# Patient Record
Sex: Female | Born: 1965
Health system: Southern US, Community
[De-identification: ages and names within clinical notes are randomized; demographics above are authoritative.]

## PROBLEM LIST (undated history)

## (undated) DIAGNOSIS — F419 Anxiety disorder, unspecified: Secondary | ICD-10-CM

## (undated) HISTORY — PX: TUBAL LIGATION: SHX77

## (undated) HISTORY — PX: CHOLECYSTECTOMY: SHX55

## (undated) HISTORY — PX: CHOLECYSTECTOMY, LAPAROSCOPIC: SHX56

## (undated) HISTORY — DX: Anxiety disorder, unspecified: F41.9

---

## 2004-04-23 ENCOUNTER — Other Ambulatory Visit: Admission: RE | Admit: 2004-04-23 | Discharge: 2004-04-23 | Payer: Self-pay | Admitting: Family Medicine

## 2011-07-30 HISTORY — PX: BREAST SURGERY: SHX581

## 2012-02-04 ENCOUNTER — Other Ambulatory Visit: Payer: Self-pay | Admitting: Unknown Physician Specialty

## 2012-02-04 DIAGNOSIS — R921 Mammographic calcification found on diagnostic imaging of breast: Secondary | ICD-10-CM

## 2012-02-11 ENCOUNTER — Ambulatory Visit
Admission: RE | Admit: 2012-02-11 | Discharge: 2012-02-11 | Disposition: A | Discharge status: Home / Self Care | Payer: No Typology Code available for payment source | Source: Ambulatory Visit | Attending: Unknown Physician Specialty | Admitting: Unknown Physician Specialty

## 2012-02-11 DIAGNOSIS — R921 Mammographic calcification found on diagnostic imaging of breast: Secondary | ICD-10-CM

## 2016-12-19 ENCOUNTER — Ambulatory Visit (INDEPENDENT_AMBULATORY_CARE_PROVIDER_SITE_OTHER): Payer: 59 | Admitting: Obstetrics & Gynecology

## 2016-12-19 ENCOUNTER — Encounter: Payer: Self-pay | Admitting: Obstetrics & Gynecology

## 2016-12-19 VITALS — BP 126/84 | Ht 65.5 in | Wt 149.8 lb

## 2016-12-19 DIAGNOSIS — Z803 Family history of malignant neoplasm of breast: Secondary | ICD-10-CM | POA: Diagnosis not present

## 2016-12-19 DIAGNOSIS — Z01419 Encounter for gynecological examination (general) (routine) without abnormal findings: Secondary | ICD-10-CM

## 2016-12-19 DIAGNOSIS — Z780 Asymptomatic menopausal state: Secondary | ICD-10-CM | POA: Diagnosis not present

## 2016-12-19 DIAGNOSIS — Z1151 Encounter for screening for human papillomavirus (HPV): Secondary | ICD-10-CM

## 2016-12-19 DIAGNOSIS — Z113 Encounter for screening for infections with a predominantly sexual mode of transmission: Secondary | ICD-10-CM

## 2016-12-19 NOTE — Patient Instructions (Signed)
1. Encounter for routine gynecological examination with Papanicolaou smear of cervix Normal Gyn exam.  Pap/HPV/Gono-Chlam done.  Will schedule screening Mammo.  2. Menopause present No HRT.  Well tolerated.  Recommend Vit D supplement, Ca++ in diet and weight bearing physical activity. - DG Bone Density; Future  3. Family history of breast cancer Mother and sister in 40's.  BrCa 1-2 neg, done by sister.  Hormone receptors pos.  Breast exam normal today.  Will schedule screening Mammo asap.   Olivia Conrad, it was a pleasure to meet you today!  I will inform you of your results as soon as available. 

## 2016-12-19 NOTE — Progress Notes (Signed)
Olivia Conrad 09-Apr-1966 [id number]   History:    51 y.o. G3P3 Divorced.  Boyfriend x 14 yrs.  4 grand-children.  RP:  New patient presenting for annual gyn exam   HPI:  Menopause x 2016.  No HRT.  Mild hot flushes and emotional moments.  No pelvic pain.  No PMB.  No vaginal d/c.  Occasionally sexually active with long term boyfriend.  Breasts wnl.  Mother Breast Ca at 4 yo and sister Breast Ca at 18 yo.  Sister BrCa1-2 Neg.  Hormone receptors pos. Both alive.    Past medical history,surgical history, family history and social history were all reviewed and documented in the EPIC chart.  Gynecologic History No LMP recorded. Contraception: post menopausal status Last Pap: 2015. Results were: normal Last mammogram: 2015. Results were: normal  Obstetric History OB History  No data available     ROS: A ROS was performed and pertinent positives and negatives are included in the history.  GENERAL: No fevers or chills. HEENT: No change in vision, no earache, sore throat or sinus congestion. NECK: No pain or stiffness. CARDIOVASCULAR: No chest pain or pressure. No palpitations. PULMONARY: No shortness of breath, cough or wheeze. GASTROINTESTINAL: No abdominal pain, nausea, vomiting or diarrhea, melena or bright red blood per rectum. GENITOURINARY: No urinary frequency, urgency, hesitancy or dysuria. MUSCULOSKELETAL: No joint or muscle pain, no back pain, no recent trauma. DERMATOLOGIC: No rash, no itching, no lesions. ENDOCRINE: No polyuria, polydipsia, no heat or cold intolerance. No recent change in weight. HEMATOLOGICAL: No anemia or easy bruising or bleeding. NEUROLOGIC: No headache, seizures, numbness, tingling or weakness. PSYCHIATRIC: No depression, no loss of interest in normal activity or change in sleep pattern.     Exam:   Ht 5' 5.5" (1.664 m)   Wt 149 lb 12.8 oz (67.9 kg)   BMI 24.55 kg/m   Body mass index is 24.55 kg/m.  General appearance : Well developed well  nourished female. No acute distress HEENT: Eyes: no retinal hemorrhage or exudates,  Neck supple, trachea midline, no carotid bruits, no thyroidmegaly Lungs: Clear to auscultation, no rhonchi or wheezes, or rib retractions  Heart: Regular rate and rhythm, no murmurs or gallops Breast:Examined in sitting and supine position were symmetrical in appearance, no palpable masses or tenderness,  no skin retraction, no nipple inversion, no nipple discharge, no skin discoloration, no axillary or supraclavicular lymphadenopathy Abdomen: no palpable masses or tenderness, no rebound or guarding Extremities: no edema or skin discoloration or tenderness  Pelvic:  Bartholin, Urethra, Skene Glands: Within normal limits             Vagina: No gross lesions or discharge  Cervix: No gross lesions or discharge.  Pap/HPV, Gono-Chlam done.  Uterus  AV, normal size, shape and consistency, non-tender and mobile  Adnexa  Without masses or tenderness  Anus and perineum  normal                 Assessment/Plan:  51 y.o. female for annual exam   1. Encounter for routine gynecological examination with Papanicolaou smear of cervix Normal Gyn exam.  Pap/HPV/Gono-Chlam done.  Will schedule screening Mammo.  2. Menopause present No HRT.  Well tolerated.  Recommend Vit D supplement, Ca++ in diet and weight bearing physical activity. - DG Bone Density; Future  3. Family history of breast cancer Mother and sister in 64's.  BrCa 1-2 neg, done by sister.  Hormone receptors pos.  Breast exam normal today.  Will schedule  screening Mammo asap.  Princess Bruins MD, 10:59 AM 12/19/2016

## 2016-12-19 NOTE — Addendum Note (Signed)
Addended by: Berna SpareASTILLO, Kamyra Schroeck A on: 12/19/2016 12:08 PM   Modules accepted: Orders

## 2016-12-25 ENCOUNTER — Other Ambulatory Visit: Payer: Self-pay | Admitting: Gynecology

## 2016-12-25 DIAGNOSIS — Z780 Asymptomatic menopausal state: Secondary | ICD-10-CM

## 2016-12-25 DIAGNOSIS — Z13820 Encounter for screening for osteoporosis: Secondary | ICD-10-CM

## 2016-12-25 LAB — PAP IG, CT-NG NAA, HPV HIGH-RISK
CHLAMYDIA PROBE AMP: NOT DETECTED
GC PROBE AMP: NOT DETECTED
HPV DNA High Risk: NOT DETECTED

## 2017-01-03 ENCOUNTER — Encounter: Payer: Self-pay | Admitting: Physician Assistant

## 2017-01-03 ENCOUNTER — Ambulatory Visit (INDEPENDENT_AMBULATORY_CARE_PROVIDER_SITE_OTHER): Payer: 59 | Admitting: Physician Assistant

## 2017-01-03 ENCOUNTER — Ambulatory Visit (INDEPENDENT_AMBULATORY_CARE_PROVIDER_SITE_OTHER): Payer: 59

## 2017-01-03 VITALS — BP 135/72 | HR 97 | Temp 98.3°F | Resp 16 | Ht 65.5 in | Wt 147.6 lb

## 2017-01-03 DIAGNOSIS — M542 Cervicalgia: Secondary | ICD-10-CM | POA: Diagnosis not present

## 2017-01-03 MED ORDER — IBUPROFEN 800 MG PO TABS: 800.0000 mg | ORAL_TABLET | Freq: Three times a day (TID) | ORAL | 0 refills | Status: DC | PRN

## 2017-01-03 MED ORDER — CYCLOBENZAPRINE HCL 5 MG PO TABS: 5.0000 mg | ORAL_TABLET | Freq: Three times a day (TID) | ORAL | 0 refills | Status: DC | PRN

## 2017-01-03 NOTE — Patient Instructions (Signed)
     IF you received an x-ray today, you will receive an invoice from [facility]. Please contact  Radiology at [phone] with questions or concerns regarding your invoice.   IF you received labwork today, you will receive an invoice from [facility]. Please contact [facility] at [phone] with questions or concerns regarding your invoice.   Our billing staff will not be able to assist you with questions regarding bills from these companies.  You will be contacted with the lab results as soon as they are available. The fastest way to get your results is to activate your My Chart account. Instructions are located on the last page of this paperwork. If you have not heard from us regarding the results in 2 weeks, please contact this office.     

## 2017-01-03 NOTE — Progress Notes (Signed)
Olivia BanMelinda Conrad  MRN: [id number] DOB: 04/24/1966  PCP: Morrell RiddleWeber, Deshane Cotroneo L, PA-C  Chief Complaint  Patient presents with  . establish care  . neck pain    started in Feb. or March per pt, "shoots pain into the shoulder" causes left arm to go numb and tinglly    Subjective:  Pt presents to clinic for establish care.  She has not had routine medical care in a while.  Just set up care with GYN and is schedule for her mammogram.  Neck pain - tried home relief - ice and heat and motrin (400mg ) - now when she moves to neck to the right side she gets numbness and tingling in her right arm with the neck movement.  No known injury.  Review of Systems  Musculoskeletal: Positive for neck pain. Negative for joint swelling and neck stiffness.    There are no active problems to display for this patient.   Current Outpatient Prescriptions on File Prior to Visit  Medication Sig Dispense Refill  . ibuprofen (ADVIL,MOTRIN) 400 MG tablet Take 400 mg by mouth every 6 (six) hours as needed.    . Multiple Vitamin (MULTIVITAMIN) tablet Take 1 tablet by mouth daily.    . Turmeric Curcumin 500 MG CAPS Take by mouth.     No current facility-administered medications on file prior to visit.     Allergies  Allergen Reactions  . Aleve [Naproxen Sodium] Hives    Pt patients past, family and social history were reviewed and updated.   Objective:  BP 135/72   Pulse 97   Temp 98.3 F (36.8 C) (Oral)   Resp 16   Ht 5' 5.5" (1.664 m)   Wt 147 lb 9.6 oz (67 kg)   SpO2 99%   BMI 24.19 kg/m   Physical Exam  Constitutional: She is oriented to person, place, and time and well-developed, well-nourished, and in no distress.  HENT:  Head: Normocephalic and atraumatic.  Right Ear: Hearing and external ear normal.  Left Ear: Hearing and external ear normal.  Eyes: Conjunctivae are normal.  Neck: Normal range of motion.  Pulmonary/Chest: Effort normal.  Musculoskeletal:       Cervical back: She exhibits  spasm (right trap). She exhibits normal range of motion and no bony tenderness.  Neurological: She is alert and oriented to person, place, and time. She displays no weakness and normal reflexes. No sensory deficit. Gait normal.  Skin: Skin is warm and dry.  Psychiatric: Mood, memory, affect and judgment normal.  Vitals reviewed.   Dg Cervical Spine 2 Or 3 Views  Result Date: 01/03/2017 CLINICAL DATA:  Neck pain for several months, no known injury, initial encounter EXAM: CERVICAL SPINE - 2-3 VIEW COMPARISON:  None. FINDINGS: Seven cervical segments are well visualized. Vertebral body height is well maintained. Osteophytic changes are noted at C5-6 and C6-7. No acute fracture or acute facet abnormality is seen. No soft tissue abnormality is noted. IMPRESSION: Degenerative change without acute abnormality. Electronically Signed   By: Alcide CleverMark  Lukens M.D.   On: 01/03/2017 12:55     Assessment and Plan :  Neck pain - Plan: DG Cervical Spine 2 or 3 views, cyclobenzaprine (FLEXERIL) 5 MG tablet, ibuprofen (ADVIL,MOTRIN) 800 MG tablet - likely muscle spasms resting on a nerve - trial of muscle relaxers and heat - she xray today does not show bad degenerative changes and her exam looks ok - RTC if not improved - pt encouraged to make a CPE for general labs,  imunizations etc.  Benny Lennert PA-C  Primary Care at [facility] 01/06/2017 8:29 AM

## 2017-01-07 ENCOUNTER — Ambulatory Visit (INDEPENDENT_AMBULATORY_CARE_PROVIDER_SITE_OTHER): Payer: 59

## 2017-01-07 ENCOUNTER — Other Ambulatory Visit: Payer: Self-pay | Admitting: Gynecology

## 2017-01-07 ENCOUNTER — Encounter: Payer: Self-pay | Admitting: Gynecology

## 2017-01-07 ENCOUNTER — Encounter: Payer: Self-pay | Admitting: Obstetrics & Gynecology

## 2017-01-07 DIAGNOSIS — M8588 Other specified disorders of bone density and structure, other site: Secondary | ICD-10-CM | POA: Diagnosis not present

## 2017-01-07 DIAGNOSIS — Z13820 Encounter for screening for osteoporosis: Secondary | ICD-10-CM | POA: Diagnosis not present

## 2017-01-07 DIAGNOSIS — Z780 Asymptomatic menopausal state: Secondary | ICD-10-CM

## 2017-01-13 ENCOUNTER — Telehealth: Payer: Self-pay | Admitting: Physician Assistant

## 2017-01-13 NOTE — Telephone Encounter (Signed)
Pt called asking about her xrays & just wanted some further explanation on them & figure out what her next step would be. She is still having shoulder issues, the meds are working & she can bear the pain but it is not completely gone away.   Please Advise

## 2017-01-13 NOTE — Telephone Encounter (Signed)
Dg Cervical Spine 2 Or 3 Views  Result Date: 01/03/2017 CLINICAL DATA:  Neck pain for several months, no known injury, initial encounter EXAM: CERVICAL SPINE - 2-3 VIEW COMPARISON:  None. FINDINGS: Seven cervical segments are well visualized. Vertebral body height is well maintained. Osteophytic changes are noted at C5-6 and C6-7. No acute fracture or acute facet abnormality is seen. No soft tissue abnormality is noted. IMPRESSION: Degenerative change without acute abnormality. Electronically Signed   By: Alcide CleverMark  Lukens M.D.   On: 01/03/2017 12:55   Please call the patient and let her know that she has degenerative changes at C5/6 C6/7.  fi she has further questions please get them for me if you are unable to answer them.

## 2017-01-13 NOTE — Telephone Encounter (Signed)
Please advise 

## 2017-01-13 NOTE — Telephone Encounter (Signed)
Olivia Conrad - Pt wants some clarification about the xrays on her neck.  [phone]

## 2017-01-14 ENCOUNTER — Encounter: Payer: Self-pay | Admitting: Physician Assistant

## 2017-01-14 NOTE — Telephone Encounter (Signed)
I will close this message as there is another message with the same information.

## 2017-01-14 NOTE — Telephone Encounter (Signed)
If the patient is still having numbness this could be a nerve problem.  It is important to know whether she has lost feeling and has weakness or whether it feels tingling.  Lost sensation means nerve damage - tingling means nerve irritation - if just tingling continue medications and PT could be helpful if the patient is willing -if lost of sensation and or weakness either a repeat with me for repeat exam and possible MRI or referral to a specialist.

## 2017-01-14 NOTE — Telephone Encounter (Signed)
I have responded to another message just like this with an explanation of her xrays that I have sent to the xray pool - we might want to see whether they have called the patient and answered her questions.  She may need to continue the medication and maybe do some PT to help with long term pain and possible return of pain.

## 2017-01-14 NOTE — Telephone Encounter (Signed)
Pls see msg below

## 2017-01-14 NOTE — Telephone Encounter (Signed)
Please advise 

## 2017-01-14 NOTE — Telephone Encounter (Signed)
Spoke with Olivia Conrad, explained the scan showed degenerative changes in her cervical spine (neck area) I explained that it is not uncommon for postmenopausal women to have degenerative changes in there bones which is why we do DEXA screens. Olivia Conrad states that the medication has improved her pain, however she still has numbness that starts in her left shoulder and radiates down her left arm. Numbness gets worse with certain position changes like laying on her left side and bending over at the waist like "touching her toes." Olivia Conrad wants to know if there is anything she can do about this numbness. She also has questions about preventing future degenerative damage. Pls advise.

## 2017-01-17 ENCOUNTER — Other Ambulatory Visit: Payer: Self-pay | Admitting: Physician Assistant

## 2017-01-17 DIAGNOSIS — M542 Cervicalgia: Secondary | ICD-10-CM

## 2017-01-17 MED ORDER — CYCLOBENZAPRINE HCL 5 MG PO TABS: 5.0000 mg | ORAL_TABLET | Freq: Three times a day (TID) | ORAL | 0 refills | Status: DC | PRN

## 2017-01-17 MED ORDER — IBUPROFEN 800 MG PO TABS: 800.0000 mg | ORAL_TABLET | Freq: Three times a day (TID) | ORAL | 0 refills | Status: DC | PRN

## 2017-01-21 NOTE — Telephone Encounter (Signed)
Called, left message on AM to call back. 

## 2017-01-23 NOTE — Telephone Encounter (Signed)
The patient returned my call, she stated that her pain in shoulder blade is totally gone, she feel so much better with her arm also. She has no tingling or numbness. She stated that she understood is a slow process of recovery, she still take her meds. until she feel that the pain is totally gone. Advised to call back as need it.

## 2017-01-28 NOTE — Telephone Encounter (Signed)
Please make a copy of her xrays and put up front for the patient to pick up

## 2017-02-05 ENCOUNTER — Encounter: Payer: Self-pay | Admitting: Physician Assistant

## 2017-02-11 ENCOUNTER — Encounter: Payer: 59 | Admitting: Physician Assistant

## 2017-04-01 ENCOUNTER — Encounter: Payer: Self-pay | Admitting: Physician Assistant

## 2017-04-01 ENCOUNTER — Ambulatory Visit (INDEPENDENT_AMBULATORY_CARE_PROVIDER_SITE_OTHER): Payer: 59 | Admitting: Physician Assistant

## 2017-04-01 VITALS — BP 108/70 | HR 69 | Temp 98.1°F | Resp 18 | Ht 66.0 in | Wt 146.0 lb

## 2017-04-01 DIAGNOSIS — Z130 Encounter for screening for diseases of the blood and blood-forming organs and certain disorders involving the immune mechanism: Secondary | ICD-10-CM | POA: Diagnosis not present

## 2017-04-01 DIAGNOSIS — Z13228 Encounter for screening for other metabolic disorders: Secondary | ICD-10-CM | POA: Diagnosis not present

## 2017-04-01 DIAGNOSIS — Z23 Encounter for immunization: Secondary | ICD-10-CM

## 2017-04-01 DIAGNOSIS — Z114 Encounter for screening for human immunodeficiency virus [HIV]: Secondary | ICD-10-CM | POA: Diagnosis not present

## 2017-04-01 DIAGNOSIS — Z13220 Encounter for screening for lipoid disorders: Secondary | ICD-10-CM

## 2017-04-01 DIAGNOSIS — Z0000 Encounter for general adult medical examination without abnormal findings: Secondary | ICD-10-CM | POA: Diagnosis not present

## 2017-04-01 DIAGNOSIS — Z1329 Encounter for screening for other suspected endocrine disorder: Secondary | ICD-10-CM | POA: Diagnosis not present

## 2017-04-01 NOTE — Progress Notes (Signed)
Olivia Conrad  MRN: [id number] DOB: 1965/12/02  PCP: Mancel Bale, PA-C  Subjective:  Pt presents to clinic for a CPE.  Last dental exam: not in years Last vision exam: wears readers Last pap: 5/18 Last mammo: 6/18  Vaccinations      Tetanus - needs  She has been seeing [employer].  She feels much better and is almost having no neck pain.  She plans to finish her treatment plan and then continue for preventative measures.  Typical meals for patient: 3 meals - home cooked - really been trying to be more health aware Typical beverage choices: water - no more coffee during weekdays, 1 sundrop a day Exercises: 1.5 mile 2-3 times a week - but also very active at home Sleeps: 8 hrs per night and sleeping well   There are no active problems to display for this patient.   Review of Systems  Constitutional: Negative.   HENT: Negative.   Eyes: Negative.   Respiratory: Negative.   Cardiovascular: Negative.   Gastrointestinal: Negative.   Endocrine: Negative.   Genitourinary: Negative.   Musculoskeletal: Negative.   Skin: Negative.   Allergic/Immunologic: Negative.   Neurological: Negative.   Hematological: Negative.   Psychiatric/Behavioral: Negative.      Current Outpatient Prescriptions on File Prior to Visit  Medication Sig Dispense Refill  . cyclobenzaprine (FLEXERIL) 5 MG tablet Take 1 tablet (5 mg total) by mouth 3 (three) times daily as needed for muscle spasms. 90 tablet 0  . ibuprofen (ADVIL,MOTRIN) 400 MG tablet Take 400 mg by mouth every 6 (six) hours as needed.    Marland Kitchen ibuprofen (ADVIL,MOTRIN) 800 MG tablet Take 1 tablet (800 mg total) by mouth every 8 (eight) hours as needed. 90 tablet 0  . Multiple Vitamin (MULTIVITAMIN) tablet Take 1 tablet by mouth daily.    . Turmeric Curcumin 500 MG CAPS Take by mouth.     No current facility-administered medications on file prior to visit.     Allergies  Allergen Reactions  . Aleve [Naproxen Sodium] Hives      Social History   Social History  . Marital status: Single    Spouse name: N/A  . Number of children: N/A  . Years of education: N/A   Occupational History  . account manager    Social History Main Topics  . Smoking status: Current Every Day Smoker    Packs/day: 0.30    Years: 32.00    Types: Cigarettes  . Smokeless tobacco: Never Used  . Alcohol use Yes     Comment: social rare  . Drug use: No  . Sexual activity: Yes    Partners: Male   Other Topics Concern  . None   Social History Narrative   Lives with youngest daughter   3 children - 2 daughters and 1 son   Grandchildren - 52      Seatbelt - 100%   Guns in home - secured    Past Surgical History:  Procedure Laterality Date  . BREAST SURGERY Right 2013   breast biopsy  . CHOLECYSTECTOMY    . CHOLECYSTECTOMY, LAPAROSCOPIC    . TUBAL LIGATION      Family History  Problem Relation Age of Onset  . Breast cancer Mother 60       20 lymphnodes removed  . Breast cancer Sister 32       invasive ductal carcinoma   . Stroke Maternal Grandmother   . Cancer Maternal Grandfather  lung  . Breast cancer Paternal Grandmother      Objective:  BP 108/70   Pulse 69   Temp 98.1 F (36.7 C) (Oral)   Resp 18   Ht _0  (1.676 m)   Wt 146 lb (66.2 kg)   SpO2 97%   BMI 23.57 kg/m   Physical Exam  Constitutional: She is oriented to person, place, and time and well-developed, well-nourished, and in no distress.  HENT:  Head: Normocephalic and atraumatic.  Right Ear: Hearing, tympanic membrane, external ear and ear canal normal.  Left Ear: Hearing, tympanic membrane, external ear and ear canal normal.  Nose: Nose normal.  Mouth/Throat: Uvula is midline, oropharynx is clear and moist and mucous membranes are normal.  Eyes: Pupils are equal, round, and reactive to light. Conjunctivae and EOM are normal.  Neck: Trachea normal and normal range of motion. Neck supple. No thyroid mass and no thyromegaly  present.  Cardiovascular: Normal rate, regular rhythm and normal heart sounds.   No murmur heard. Pulmonary/Chest: Effort normal and breath sounds normal. She has no wheezes.  Abdominal: Soft. Bowel sounds are normal. There is no tenderness.  Musculoskeletal: Normal range of motion.  Lymphadenopathy:    She has no cervical adenopathy.  Neurological: She is alert and oriented to person, place, and time. She has normal motor skills, normal sensation, normal strength and normal reflexes. Gait normal.  Skin: Skin is warm and dry.  Psychiatric: Mood, memory, affect and judgment normal.    Wt Readings from Last 3 Encounters:  04/01/17 146 lb (66.2 kg)  01/03/17 147 lb 9.6 oz (67 kg)  12/19/16 149 lb 12.8 oz (67.9 kg)     Visual Acuity Screening   Right eye Left eye Both eyes  Without correction: 20/13 20/13 20/10-2  With correction:       Assessment and Plan :  Annual physical  - anticipatory guidance - encouraged patient to make a dentist appt.  Need for diphtheria-tetanus-pertussis (Tdap) vaccine - Plan: Tdap vaccine greater than or equal to 7yo IM  Screening for metabolic disorder - Plan: CMP14+EGFR  Screening, lipid - Plan: Lipid panel  Screening for thyroid disorder - Plan: TSH  Screening for HIV (human immunodeficiency virus) - Plan: HIV antibody  Screening for deficiency anemia - Plan: CBC with Differential/Platelet  Windell Hummingbird PA-C  Primary Care at [place] 04/01/2017 9:22 AM

## 2017-04-01 NOTE — Patient Instructions (Addendum)
I will contact you with your lab results as soon as they are available.   If you have not heard from me in 2 weeks, please contact me.  The fastest way to get your results is to register for My Chart (see the instructions on this printout).     IF you received an x-ray today, you will receive an invoice from [facility]. Please contact [facility] at [phone] with questions or concerns regarding your invoice.   IF you received labwork today, you will receive an invoice from [facility]. Please contact [facility] at [phone] with questions or concerns regarding your invoice.   Our billing staff will not be able to assist you with questions regarding bills from these companies.  You will be contacted with the lab results as soon as they are available. The fastest way to get your results is to activate your My Chart account. Instructions are located on the last page of this paperwork. If you have not heard from us regarding the results in 2 weeks, please contact this office.    Health Maintenance, Female Adopting a healthy lifestyle and getting preventive care can go a long way to promote health and wellness. Talk with your health care provider about what schedule of regular examinations is right for you. This is a good chance for you to check in with your provider about disease prevention and staying healthy. In between checkups, there are plenty of things you can do on your own. Experts have done a lot of research about which lifestyle changes and preventive measures are most likely to keep you healthy. Ask your health care provider for more information. Weight and diet Eat a healthy diet  Be sure to include plenty of vegetables, fruits, low-fat dairy products, and lean protein.  Do not eat a lot of foods high in solid fats, added sugars, or salt.  Get regular exercise. This is one of the most important things you can do for your health. ? Most adults should exercise  for at least 150 minutes each week. The exercise should increase your heart rate and make you sweat (moderate-intensity exercise). ? Most adults should also do strengthening exercises at least twice a week. This is in addition to the moderate-intensity exercise.  Maintain a healthy weight  Body mass index (BMI) is a measurement that can be used to identify possible weight problems. It estimates body fat based on height and weight. Your health care provider can help determine your BMI and help you achieve or maintain a healthy weight.  For females 20 years of age and older: ? A BMI below 18.5 is considered underweight. ? A BMI of 18.5 to 24.9 is normal. ? A BMI of 25 to 29.9 is considered overweight. ? A BMI of 30 and above is considered obese.  Watch levels of cholesterol and blood lipids  You should start having your blood tested for lipids and cholesterol at 49 years of age, then have this test every 5 years.  You may need to have your cholesterol levels checked more often if: ? Your lipid or cholesterol levels are high. ? You are older than 50 years of age. ? You are at high risk for heart disease.  Cancer screening Lung Cancer  Lung cancer screening is recommended for adults 55-80 years old who are at high risk for lung cancer because of a history of smoking.  A yearly low-dose CT scan of the lungs is recommended for people who: ? Currently smoke. ?   within the past 15 years. ? Have at least a 30-pack-year history of smoking. A pack year is smoking an average of one pack of cigarettes a day for 1 year.  Yearly screening should continue until it has been 15 years since you quit.  Yearly screening should stop if you develop a health problem that would prevent you from having lung cancer treatment.  Breast Cancer  Practice breast self-awareness. This means understanding how your breasts normally appear and feel.  It also means doing regular breast self-exams. Let your  health care provider know about any changes, no matter how small.  If you are in your 20s or 30s, you should have a clinical breast exam (CBE) by a health care provider every 1-3 years as part of a regular health exam.  If you are 23 or older, have a CBE every year. Also consider having a breast X-ray (mammogram) every year.  If you have a family history of breast cancer, talk to your health care provider about genetic screening.  If you are at high risk for breast cancer, talk to your health care provider about having an MRI and a mammogram every year.  Breast cancer gene (BRCA) assessment is recommended for women who have family members with BRCA-related cancers. BRCA-related cancers include: ? Breast. ? Ovarian. ? Tubal. ? Peritoneal cancers.  Results of the assessment will determine the need for genetic counseling and BRCA1 and BRCA2 testing.  Cervical Cancer Your health care provider may recommend that you be screened regularly for cancer of the pelvic organs (ovaries, uterus, and vagina). This screening involves a pelvic examination, including checking for microscopic changes to the surface of your cervix (Pap test). You may be encouraged to have this screening done every 3 years, beginning at age 60.  For women ages 18-65, health care providers may recommend pelvic exams and Pap testing every 3 years, or they may recommend the Pap and pelvic exam, combined with testing for human papilloma virus (HPV), every 5 years. Some types of HPV increase your risk of cervical cancer. Testing for HPV may also be done on women of any age with unclear Pap test results.  Other health care providers may not recommend any screening for nonpregnant women who are considered low risk for pelvic cancer and who do not have symptoms. Ask your health care provider if a screening pelvic exam is right for you.  If you have had past treatment for cervical cancer or a condition that could lead to cancer, you need  Pap tests and screening for cancer for at least 20 years after your treatment. If Pap tests have been discontinued, your risk factors (such as having a new sexual partner) need to be reassessed to determine if screening should resume. Some women have medical problems that increase the chance of getting cervical cancer. In these cases, your health care provider may recommend more frequent screening and Pap tests.  Colorectal Cancer  This type of cancer can be detected and often prevented.  Routine colorectal cancer screening usually begins at 49 years of age and continues through 49 years of age.  Your health care provider may recommend screening at an earlier age if you have risk factors for colon cancer.  Your health care provider may also recommend using home test kits to check for hidden blood in the stool.  A small camera at the end of a tube can be used to examine your colon directly (sigmoidoscopy or colonoscopy). This is done to check  for the earliest forms of colorectal cancer.  Routine screening usually begins at age 75.  Direct examination of the colon should be repeated every 5-10 years through 49 years of age. However, you may need to be screened more often if early forms of precancerous polyps or small growths are found.  Skin Cancer  Check your skin from head to toe regularly.  Tell your health care provider about any new moles or changes in moles, especially if there is a change in a mole's shape or color.  Also tell your health care provider if you have a mole that is larger than the size of a pencil eraser.  Always use sunscreen. Apply sunscreen liberally and repeatedly throughout the day.  Protect yourself by wearing long sleeves, pants, a wide-brimmed hat, and sunglasses whenever you are outside.  Heart disease, diabetes, and high blood pressure  High blood pressure causes heart disease and increases the risk of stroke. High blood pressure is more likely to develop  in: ? People who have blood pressure in the high end of the normal range (130-139/85-89 mm Hg). ? People who are overweight or obese. ? People who are African American.  If you are 41-67 years of age, have your blood pressure checked every 3-5 years. If you are 32 years of age or older, have your blood pressure checked every year. You should have your blood pressure measured twice-once when you are at a hospital or clinic, and once when you are not at a hospital or clinic. Record the average of the two measurements. To check your blood pressure when you are not at a hospital or clinic, you can use: ? An automated blood pressure machine at a pharmacy. ? A home blood pressure monitor.  If you are between 44 years and 70 years old, ask your health care provider if you should take aspirin to prevent strokes.  Have regular diabetes screenings. This involves taking a blood sample to check your fasting blood sugar level. ? If you are at a normal weight and have a low risk for diabetes, have this test once every three years after 49 years of age. ? If you are overweight and have a high risk for diabetes, consider being tested at a younger age or more often. Preventing infection Hepatitis B  If you have a higher risk for hepatitis B, you should be screened for this virus. You are considered at high risk for hepatitis B if: ? You were born in a country where hepatitis B is common. Ask your health care provider which countries are considered high risk. ? Your parents were born in a high-risk country, and you have not been immunized against hepatitis B (hepatitis B vaccine). ? You have HIV or AIDS. ? You use needles to inject street drugs. ? You live with someone who has hepatitis B. ? You have had sex with someone who has hepatitis B. ? You get hemodialysis treatment. ? You take certain medicines for conditions, including cancer, organ transplantation, and autoimmune conditions.  Hepatitis C  Blood  testing is recommended for: ? Everyone born from [birth year] through 1963. ? Anyone with known risk factors for hepatitis C.  Sexually transmitted infections (STIs)  You should be screened for sexually transmitted infections (STIs) including gonorrhea and chlamydia if: ? You are sexually active and are younger than 49 years of age. ? You are older than 49 years of age and your health care provider tells you that you are at risk for  this type of infection. ? Your sexual activity has changed since you were last screened and you are at an increased risk for chlamydia or gonorrhea. Ask your health care provider if you are at risk.  If you do not have HIV, but are at risk, it may be recommended that you take a prescription medicine daily to prevent HIV infection. This is called pre-exposure prophylaxis (PrEP). You are considered at risk if: ? You are sexually active and do not regularly use condoms or know the HIV status of your partner(s). ? You take drugs by injection. ? You are sexually active with a partner who has HIV.  Talk with your health care provider about whether you are at high risk of being infected with HIV. If you choose to begin PrEP, you should first be tested for HIV. You should then be tested every 3 months for as long as you are taking PrEP. Pregnancy  If you are premenopausal and you may become pregnant, ask your health care provider about preconception counseling.  If you may become pregnant, take 400 to 800 micrograms (mcg) of folic acid every day.  If you want to prevent pregnancy, talk to your health care provider about birth control (contraception). Osteoporosis and menopause  Osteoporosis is a disease in which the bones lose minerals and strength with aging. This can result in serious bone fractures. Your risk for osteoporosis can be identified using a bone density scan.  If you are 68 years of age or older, or if you are at risk for osteoporosis and fractures, ask your  health care provider if you should be screened.  Ask your health care provider whether you should take a calcium or vitamin D supplement to lower your risk for osteoporosis.  Menopause may have certain physical symptoms and risks.  Hormone replacement therapy may reduce some of these symptoms and risks. Talk to your health care provider about whether hormone replacement therapy is right for you. Follow these instructions at home:  Schedule regular health, dental, and eye exams.  Stay current with your immunizations.  Do not use any tobacco products including cigarettes, chewing tobacco, or electronic cigarettes.  If you are pregnant, do not drink alcohol.  If you are breastfeeding, limit how much and how often you drink alcohol.  Limit alcohol intake to no more than 1 drink per day for nonpregnant women. One drink equals 12 ounces of beer, 5 ounces of wine, or 1 ounces of hard liquor.  Do not use street drugs.  Do not share needles.  Ask your health care provider for help if you need support or information about quitting drugs.  Tell your health care provider if you often feel depressed.  Tell your health care provider if you have ever been abused or do not feel safe at home. This information is not intended to replace advice given to you by your health care provider. Make sure you discuss any questions you have with your health care provider. Document Released: 01/28/2011 Document Revised: 12/21/2015 Document Reviewed: 04/18/2015 Elsevier Interactive Patient Education  [employer].

## 2017-04-02 LAB — CMP14+EGFR
A/G RATIO: 1.6 (ref 1.2–2.2)
ALT: 8 IU/L (ref 0–32)
AST: 12 IU/L (ref 0–40)
Albumin: 4.4 g/dL (ref 3.5–5.5)
Alkaline Phosphatase: 104 IU/L (ref 39–117)
BILIRUBIN TOTAL: 0.2 mg/dL (ref 0.0–1.2)
BUN/Creatinine Ratio: 12 (ref 9–23)
BUN: 8 mg/dL (ref 6–24)
CHLORIDE: 103 mmol/L (ref 96–106)
CO2: 24 mmol/L (ref 20–29)
Calcium: 9.6 mg/dL (ref 8.7–10.2)
Creatinine, Ser: 0.69 mg/dL (ref 0.57–1.00)
GFR calc non Af Amer: 103 mL/min/{1.73_m2} (ref >59)
GFR, EST AFRICAN AMERICAN: 119 mL/min/{1.73_m2} (ref >59)
Globulin, Total: 2.7 g/dL (ref 1.5–4.5)
Glucose: 94 mg/dL (ref 65–99)
POTASSIUM: 4.4 mmol/L (ref 3.5–5.2)
Sodium: 143 mmol/L (ref 134–144)
Total Protein: 7.1 g/dL (ref 6.0–8.5)

## 2017-04-02 LAB — LIPID PANEL
CHOL/HDL RATIO: 4.8 ratio — AB (ref 0.0–4.4)
Cholesterol, Total: 272 mg/dL — ABNORMAL HIGH (ref 100–199)
HDL: 57 mg/dL (ref >39)
LDL Calculated: 191 mg/dL — ABNORMAL HIGH (ref 0–99)
TRIGLYCERIDES: 118 mg/dL (ref 0–149)
VLDL Cholesterol Cal: 24 mg/dL (ref 5–40)

## 2017-04-02 LAB — CBC WITH DIFFERENTIAL/PLATELET
BASOS: 1 %
Basophils Absolute: 0.1 10*3/uL (ref 0.0–0.2)
EOS (ABSOLUTE): 0.4 10*3/uL (ref 0.0–0.4)
Eos: 4 %
Hematocrit: 42.9 % (ref 34.0–46.6)
Hemoglobin: 14 g/dL (ref 11.1–15.9)
Immature Grans (Abs): 0 10*3/uL (ref 0.0–0.1)
Immature Granulocytes: 0 %
LYMPHS ABS: 3.5 10*3/uL — AB (ref 0.7–3.1)
Lymphs: 40 %
MCH: 30.2 pg (ref 26.6–33.0)
MCHC: 32.6 g/dL (ref 31.5–35.7)
MCV: 93 fL (ref 79–97)
MONOS ABS: 0.7 10*3/uL (ref 0.1–0.9)
Monocytes: 8 %
NEUTROS ABS: 4.1 10*3/uL (ref 1.4–7.0)
Neutrophils: 47 %
PLATELETS: 297 10*3/uL (ref 150–379)
RBC: 4.64 x10E6/uL (ref 3.77–5.28)
RDW: 13.7 % (ref 12.3–15.4)
WBC: 8.9 10*3/uL (ref 3.4–10.8)

## 2017-04-02 LAB — HIV ANTIBODY (ROUTINE TESTING W REFLEX): HIV Screen 4th Generation wRfx: NONREACTIVE

## 2017-04-02 LAB — TSH: TSH: 0.798 u[IU]/mL (ref 0.450–4.500)

## 2017-06-10 IMAGING — DX DG CERVICAL SPINE 2 OR 3 VIEWS
3 series · 3 of 3 positions shown · non-contrast
Comparison: None.

CLINICAL DATA: Neck pain for several months, no known injury,
initial encounter

EXAM:
CERVICAL SPINE - 2-3 VIEW

[c-spine lat]
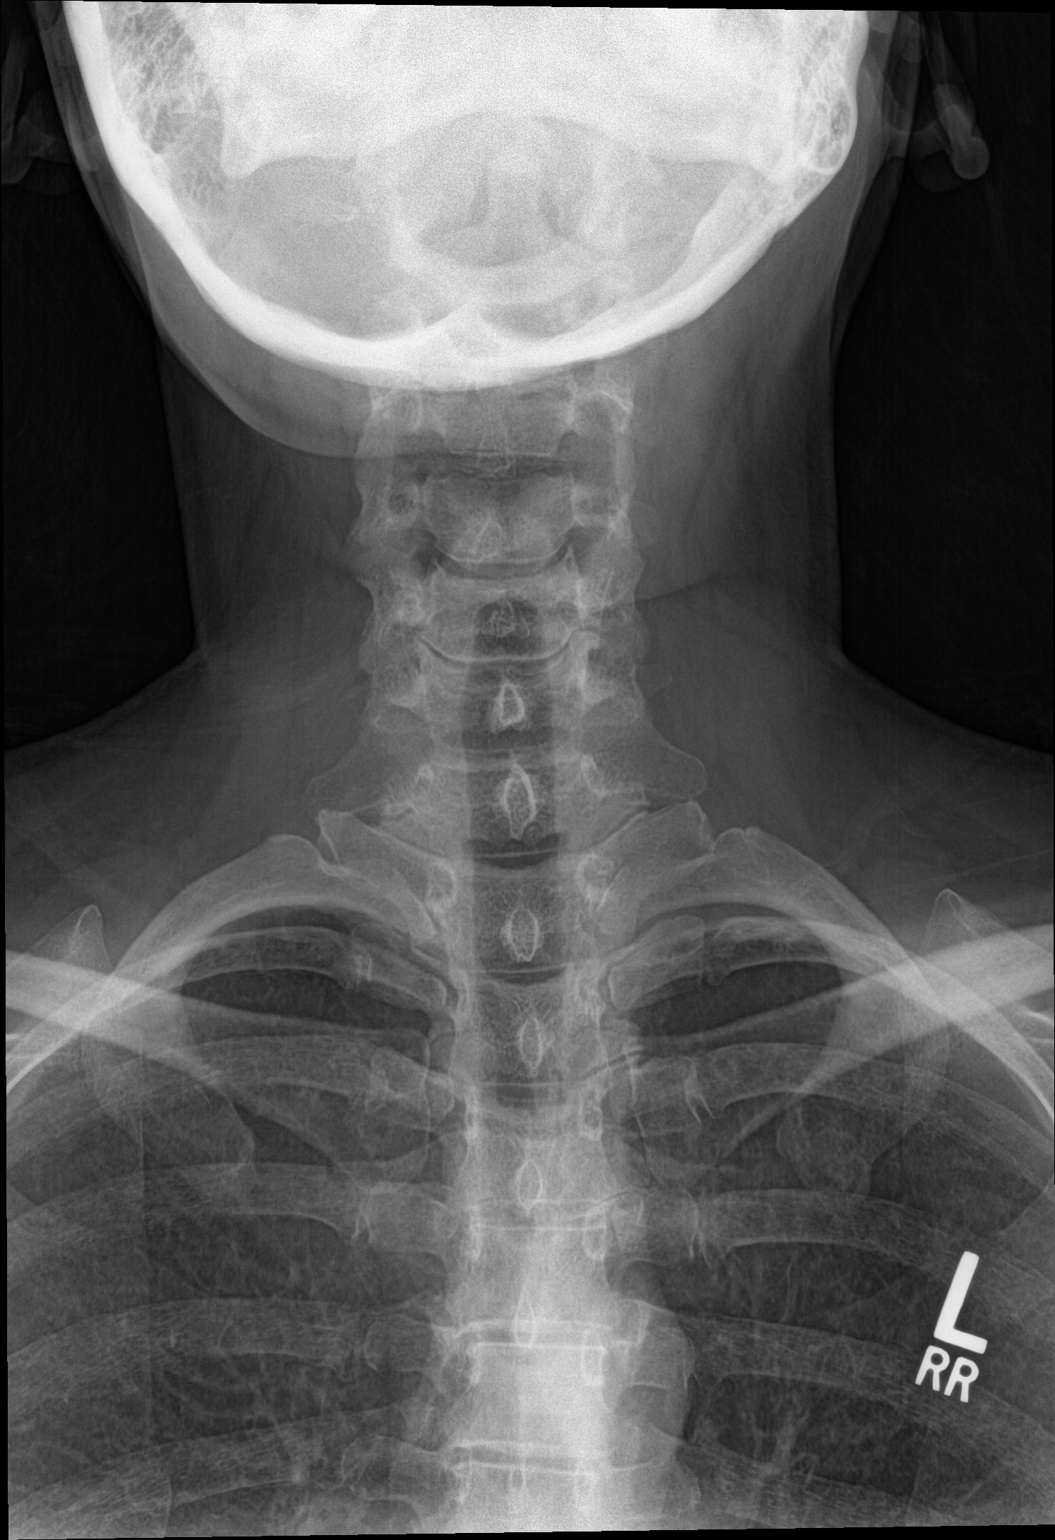

[c-spine open mouth]
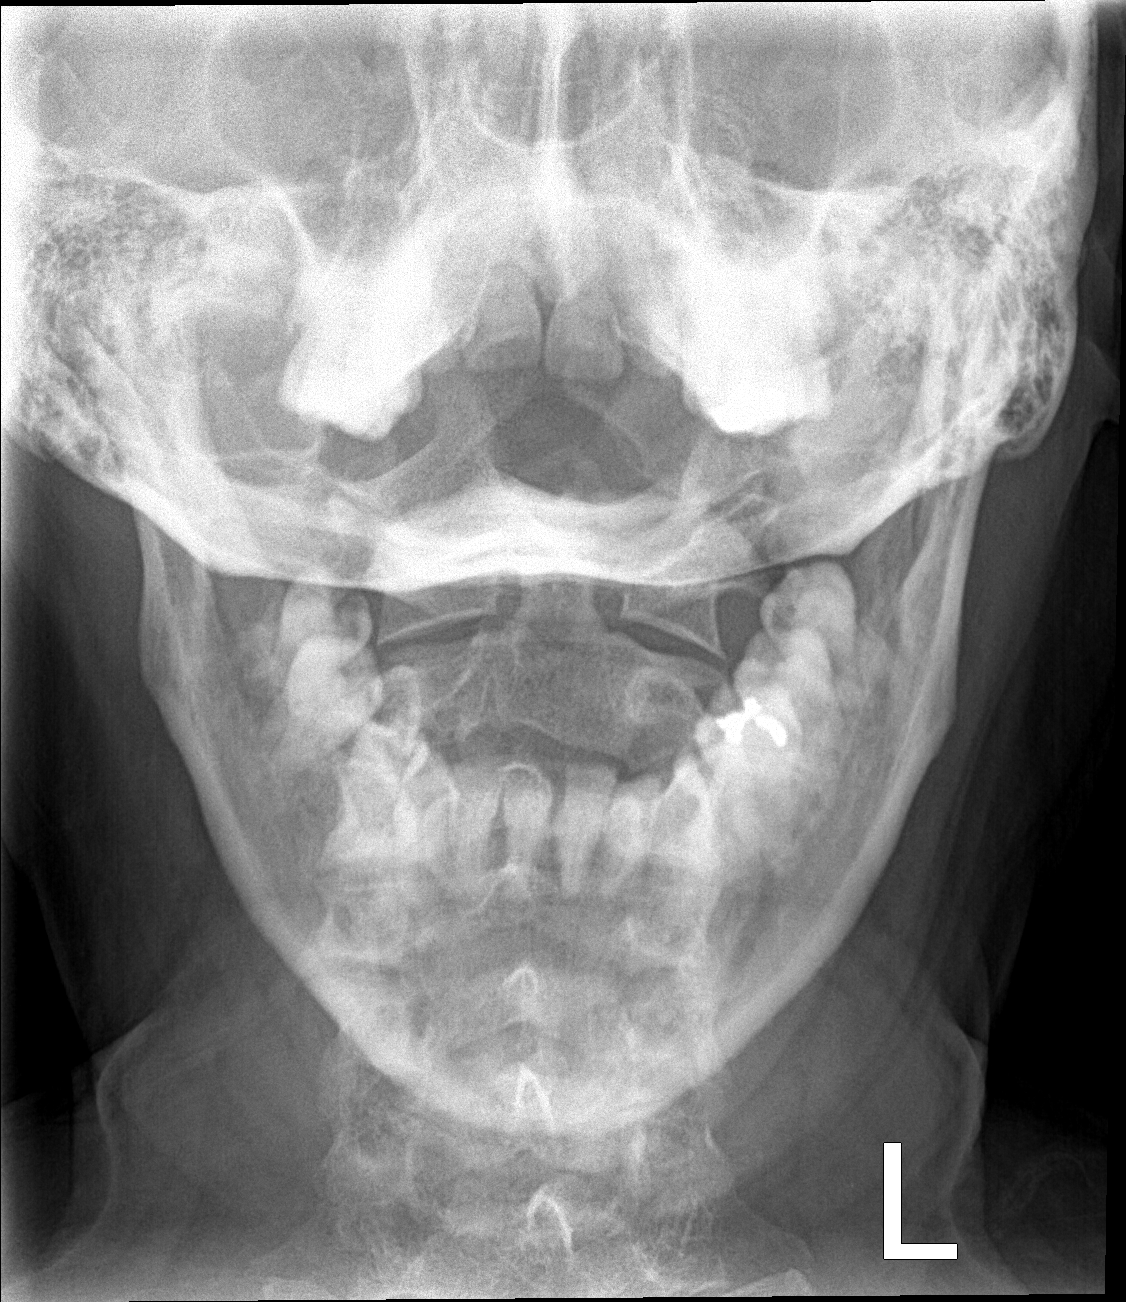

[[person_name]]
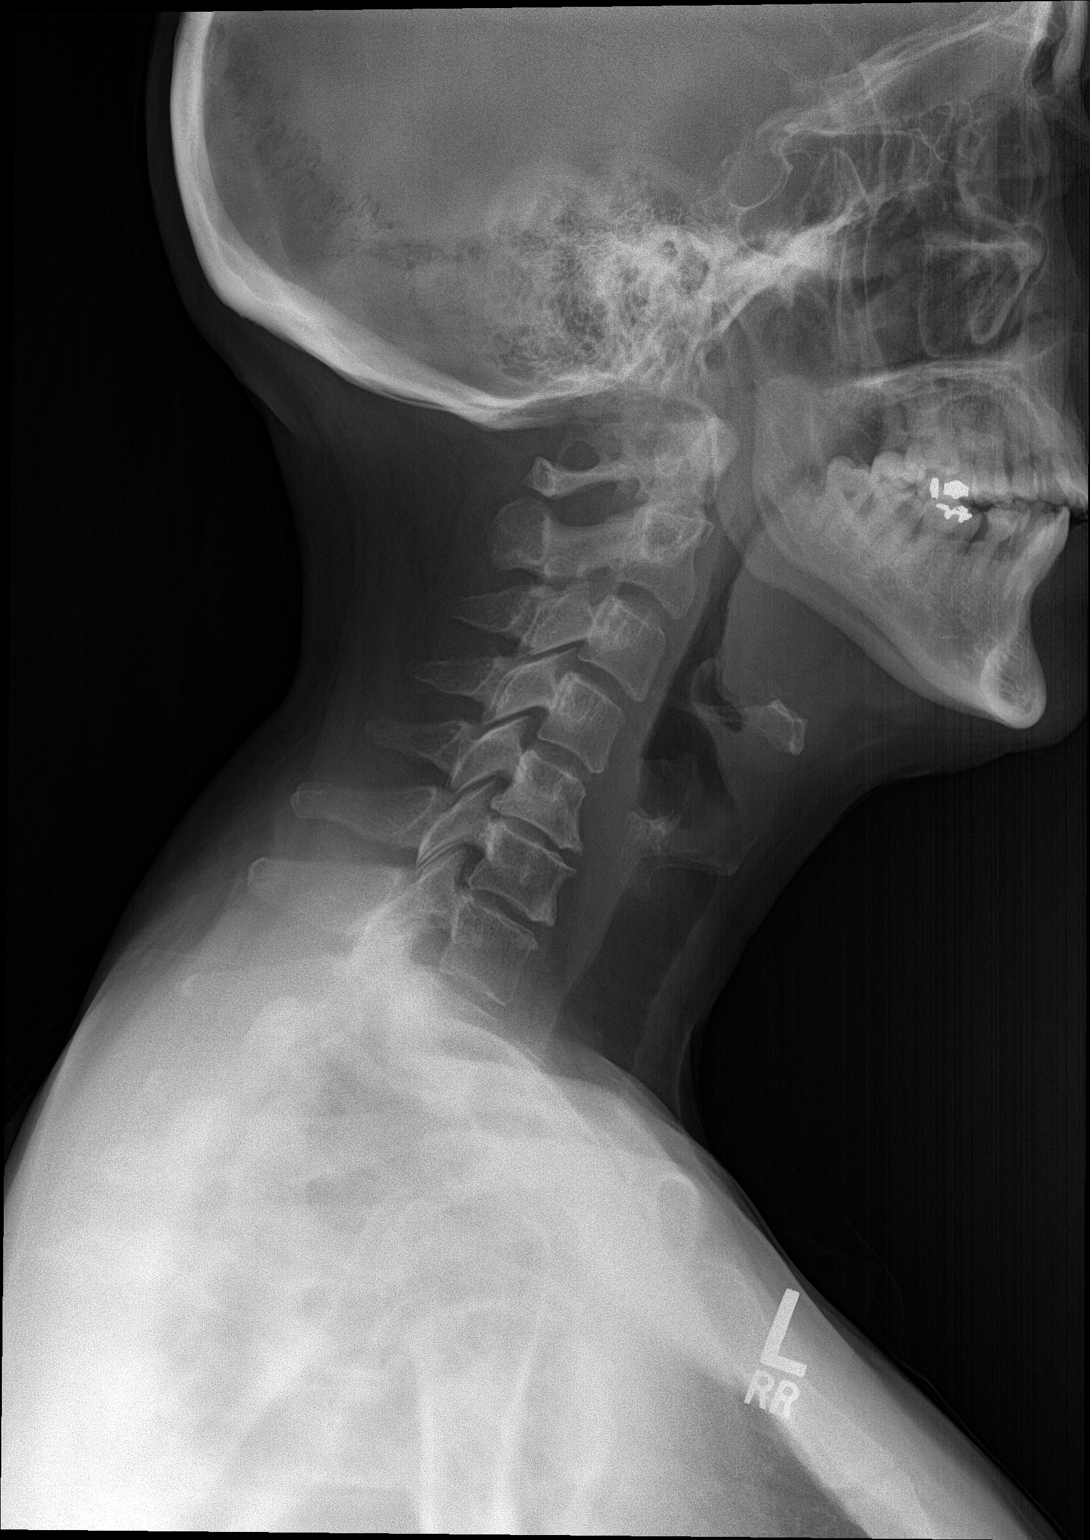

[3 of 3 positions shown; findings below may reference images not displayed]

FINDINGS: Seven cervical segments are well visualized. Vertebral body height
is well maintained. Osteophytic changes are noted at C5-6 and C6-7.
No acute fracture or acute facet abnormality is seen. No soft tissue
abnormality is noted.
IMPRESSION: Degenerative change without acute abnormality.

## 2017-12-23 ENCOUNTER — Ambulatory Visit (INDEPENDENT_AMBULATORY_CARE_PROVIDER_SITE_OTHER): Payer: 59 | Admitting: Obstetrics & Gynecology

## 2017-12-23 ENCOUNTER — Encounter: Payer: Self-pay | Admitting: Obstetrics & Gynecology

## 2017-12-23 VITALS — BP 126/78 | Ht 65.5 in | Wt 148.7 lb

## 2017-12-23 DIAGNOSIS — Z01419 Encounter for gynecological examination (general) (routine) without abnormal findings: Secondary | ICD-10-CM | POA: Diagnosis not present

## 2017-12-23 DIAGNOSIS — F329 Major depressive disorder, single episode, unspecified: Secondary | ICD-10-CM

## 2017-12-23 DIAGNOSIS — Z803 Family history of malignant neoplasm of breast: Secondary | ICD-10-CM

## 2017-12-23 DIAGNOSIS — Z780 Asymptomatic menopausal state: Secondary | ICD-10-CM | POA: Diagnosis not present

## 2017-12-23 NOTE — Patient Instructions (Signed)
1. Well female exam with routine gynecological exam Normal gynecologic exam.  Last Pap test May 2018 was negative with negative high risk HPV.  Patient abstinence since then.  Will repeat Pap test at 3 years.  Breast exam normal.  Last screening mammogram Benign in mother and sister with breast cancer.  June 2018.  Health labs with family physician.  Continue with good nutrition and fitness.  2. Menopause present Well on no hormone replacement therapy.  No postmenopausal bleeding.  Last bone density in June 2018 showed mild osteopenia only at the spine with a T score of -1.3.  Will repeat bone density at 3 years.  Continue with vitamin D supplements, calcium rich nutrition and regular weightbearing physical activity.  3. Family history of breast cancer Mother and sister with breast cancer.  4. Reactive depression Episodes of depressive symptoms and increased emotionality.  No suicidal ideations or plans.  Counseling done on readjusting her life with more social activities and physical activities.  Recommend psychotherapy.  Will not start on antidepressive medication at this time.  Olivia Conrad, it was a pleasure seeing you today!

## 2017-12-23 NOTE — Progress Notes (Signed)
Olivia Conrad 04/09/1966 [id number]   History:    52 y.o. Divorced.  Lives with her daughter.  Boyfriend x 15 years.  4 Grand-children.  RP:  Established patient presenting for annual gyn exam   HPI:  Menopause x 3 years.  No HRT.  No PMB.  No pelvic pain.  Urine/BMs wnl.  Normal vaginal secretions.  Abstinent x 2 years.  Boyfriend is more a companion, not sure about wanting to continue the relationship.  Breasts normal.  Mother and sister with breast ca.  Has episodes of depressive symptoms, over emotionality with crying.  No suicidal ideations or plans.  Feels that the episodes are getting worse in the last year.  Eating and sleeping well.  Just got a [employer], walking him every day.  Body mass index 24.37.  Health labs with Fam MD.  Past medical history,surgical history, family history and social history were all reviewed and documented in the EPIC chart.  Gynecologic History No LMP recorded. Patient is postmenopausal. Contraception: abstinence Last Pap: 11/2016. Results were: Negative, HPV HR neg Last mammogram: 12/2016. Results were: Benign Bone Density: 12/2016:  Mild Osteopenia only at Spine T-Score -1.3 Colonoscopy: Never  Obstetric History OB History  Gravida Para Term Preterm AB Living  SAB TAB Ectopic Multiple Live Births  1            # Outcome Date GA Lbr Len/2nd Weight Sex Delivery Anes PTL Lv  4 SAB           3 Para           2 Para           1 Para              ROS: A ROS was performed and pertinent positives and negatives are included in the history.  GENERAL: No fevers or chills. HEENT: No change in vision, no earache, sore throat or sinus congestion. NECK: No pain or stiffness. CARDIOVASCULAR: No chest pain or pressure. No palpitations. PULMONARY: No shortness of breath, cough or wheeze. GASTROINTESTINAL: No abdominal pain, nausea, vomiting or diarrhea, melena or bright red blood per rectum. GENITOURINARY: No urinary frequency, urgency, hesitancy  or dysuria. MUSCULOSKELETAL: No joint or muscle pain, no back pain, no recent trauma. DERMATOLOGIC: No rash, no itching, no lesions. ENDOCRINE: No polyuria, polydipsia, no heat or cold intolerance. No recent change in weight. HEMATOLOGICAL: No anemia or easy bruising or bleeding. NEUROLOGIC: No headache, seizures, numbness, tingling or weakness. PSYCHIATRIC: No depression, no loss of interest in normal activity or change in sleep pattern.     Exam:   BP 126/78   Ht 5' 5.5" (1.664 m)   Wt 148 lb 11.2 oz (67.4 kg)   BMI 24.37 kg/m   Body mass index is 24.37 kg/m.  General appearance : Well developed well nourished female. No acute distress HEENT: Eyes: no retinal hemorrhage or exudates,  Neck supple, trachea midline, no carotid bruits, no thyroidmegaly Lungs: Clear to auscultation, no rhonchi or wheezes, or rib retractions  Heart: Regular rate and rhythm, no murmurs or gallops Breast:Examined in sitting and supine position were symmetrical in appearance, no palpable masses or tenderness,  no skin retraction, no nipple inversion, no nipple discharge, no skin discoloration, no axillary or supraclavicular lymphadenopathy Abdomen: no palpable masses or tenderness, no rebound or guarding Extremities: no edema or skin discoloration or tenderness  Pelvic: Vulva: Normal  Vagina: No gross lesions or discharge  Cervix: No gross lesions or discharge  Uterus  AV, normal size, shape and consistency, non-tender and mobile  Adnexa  Without masses or tenderness  Anus: Normal   Assessment/Plan:  52 y.o. female for annual exam   1. Well female exam with routine gynecological exam Normal gynecologic exam.  Last Pap test May 2018 was negative with negative high risk HPV.  Patient abstinence since then.  Will repeat Pap test at 3 years.  Breast exam normal.  Last screening mammogram Benign in mother and sister with breast cancer.  June 2018.  Health labs with family physician.  Continue with  good nutrition and fitness.  2. Menopause present Well on no hormone replacement therapy.  No postmenopausal bleeding.  Last bone density in June 2018 showed mild osteopenia only at the spine with a T score of -1.3.  Will repeat bone density at 3 years.  Continue with vitamin D supplements, calcium rich nutrition and regular weightbearing physical activity.  3. Family history of breast cancer Mother and sister with breast cancer.  4. Reactive depression Episodes of depressive symptoms and increased emotionality.  No suicidal ideations or plans.  Counseling done on readjusting her life with more social activities and physical activities.  Recommend psychotherapy.  Will not start on antidepressive medication at this time.  Counseling on above issues and coordination of care more than 50% for 10 minutes.  Genia Del MD, 11:38 AM 12/23/2017

## 2017-12-24 ENCOUNTER — Encounter: Payer: Self-pay | Admitting: *Deleted

## 2018-01-14 ENCOUNTER — Encounter: Payer: Self-pay | Admitting: Anesthesiology

## 2019-04-21 ENCOUNTER — Encounter: Payer: Self-pay | Admitting: Gynecology

## 2020-02-02 ENCOUNTER — Encounter: Payer: Self-pay | Admitting: Anesthesiology

## 2020-03-30 ENCOUNTER — Encounter: Payer: Self-pay | Admitting: Obstetrics & Gynecology

## 2020-09-15 ENCOUNTER — Encounter: Payer: Self-pay | Admitting: Obstetrics & Gynecology

## 2020-09-15 ENCOUNTER — Ambulatory Visit: Payer: BC Managed Care – PPO | Admitting: Obstetrics & Gynecology

## 2020-09-15 ENCOUNTER — Other Ambulatory Visit: Payer: Self-pay

## 2020-09-15 VITALS — BP 112/80 | HR 78 | Ht 65.35 in | Wt 142.0 lb

## 2020-09-15 DIAGNOSIS — Z780 Asymptomatic menopausal state: Secondary | ICD-10-CM

## 2020-09-15 DIAGNOSIS — F17210 Nicotine dependence, cigarettes, uncomplicated: Secondary | ICD-10-CM | POA: Diagnosis not present

## 2020-09-15 DIAGNOSIS — Z01419 Encounter for gynecological examination (general) (routine) without abnormal findings: Secondary | ICD-10-CM | POA: Diagnosis not present

## 2020-09-15 DIAGNOSIS — N8110 Cystocele, unspecified: Secondary | ICD-10-CM

## 2020-09-15 NOTE — Progress Notes (Signed)
Olivia Conrad 1966-02-22 [id number]   History:    55 y.o. Divorced.  Lives with her daughter.  Boyfriend x 15 years.  4 Grand-children.  RP:  Established patient presenting for annual gyn exam   HPI:  Postmenopause x 3 years.  No HRT.  No PMB.  No pelvic pain.  Urine/BMs wnl.  Normal vaginal secretions.  Abstinent x 2 years.  Boyfriend is more a companion, not sure about wanting to continue the relationship.  Breasts normal.  Mother and sister with breast ca.  Has episodes of depressive symptoms, overemotionality with crying.  No suicidal ideations or plans.  Feels that the episodes are getting worse in the last year.  Eating and sleeping well.  Just got a [employer], walking him every day.  Body mass index 24.37.  Health labs with Fam MD.   Past medical history,surgical history, family history and social history were all reviewed and documented in the EPIC chart.  Gynecologic History No LMP recorded. Patient is postmenopausal.  Obstetric History OB History  Gravida Para Term Preterm AB Living  4 3     1 3   SAB IAB Ectopic Multiple Live Births  1            # Outcome Date GA Lbr Len/2nd Weight Sex Delivery Anes PTL Lv  4 SAB           3 Para           2 Para           1 Para              ROS: A ROS was performed and pertinent positives and negatives are included in the history.  GENERAL: No fevers or chills. HEENT: No change in vision, no earache, sore throat or sinus congestion. NECK: No pain or stiffness. CARDIOVASCULAR: No chest pain or pressure. No palpitations. PULMONARY: No shortness of breath, cough or wheeze. GASTROINTESTINAL: No abdominal pain, nausea, vomiting or diarrhea, melena or bright red blood per rectum. GENITOURINARY: No urinary frequency, urgency, hesitancy or dysuria. MUSCULOSKELETAL: No joint or muscle pain, no back pain, no recent trauma. DERMATOLOGIC: No rash, no itching, no lesions. ENDOCRINE: No polyuria, polydipsia, no heat or cold intolerance. No recent  change in weight. HEMATOLOGICAL: No anemia or easy bruising or bleeding. NEUROLOGIC: No headache, seizures, numbness, tingling or weakness. PSYCHIATRIC: No depression, no loss of interest in normal activity or change in sleep pattern.     Exam:   BP 112/80 (BP Location: Right Arm)   Pulse 78   Wt 143 lb 9.6 oz (65.1 kg)   BMI 23.53 kg/m   Body mass index is 23.53 kg/m.  General appearance : Well developed well nourished female. No acute distress HEENT: Eyes: no retinal hemorrhage or exudates,  Neck supple, trachea midline, no carotid bruits, no thyroidmegaly Lungs: Clear to auscultation, no rhonchi or wheezes, or rib retractions  Heart: Regular rate and rhythm, no murmurs or gallops Breast:Examined in sitting and supine position were symmetrical in appearance, no palpable masses or tenderness,  no skin retraction, no nipple inversion, no nipple discharge, no skin discoloration, no axillary or supraclavicular lymphadenopathy Abdomen: no palpable masses or tenderness, no rebound or guarding Extremities: no edema or skin discoloration or tenderness  Pelvic: Vulva: Normal             Vagina: No gross lesions or discharge.  Cystocele grade 1/4 with valsalva.  Cervix: No gross lesions or discharge.  Pap reflex done.  Uterus  AV, normal size, shape and consistency, non-tender and mobile  Adnexa  Without masses or tenderness  Anus: Normal   Assessment/Plan:  55 y.o. female for annual exam   1. Encounter for routine gynecological examination with Papanicolaou smear of cervix Normal gynecologic exam in menopause.  Pap reflex done.  Breast exam normal.  Screening mammogram June 2021 was negative on the left side and required a right diagnostic mammogram that was benign September 2021.  Needs to schedule a colonoscopy.  Health labs with family physician.  Body mass index good at 23.53.  Continue with fitness and healthy nutrition.  2. Postmenopause Well on no hormone replacement therapy.  No  postmenopausal bleeding.  3. Baden-Walker grade 1 cystocele Minimally symptomatic cystocele grade 1.  Importance of not overfilling the bladder discussed.  Avoid pelvic pressure.  Recommend Kegel exercises.  4. Cigarette smoker Strongly recommend to wean and stop cigarette smoking.  Genia Del MD, 11:48 AM 09/15/2020

## 2020-09-18 LAB — PAP IG W/ RFLX HPV ASCU

## 2020-09-23 ENCOUNTER — Encounter: Payer: Self-pay | Admitting: Obstetrics & Gynecology

## 2021-01-30 ENCOUNTER — Encounter: Payer: Self-pay | Admitting: Obstetrics & Gynecology

## 2022-02-06 ENCOUNTER — Encounter: Payer: Self-pay | Admitting: Obstetrics & Gynecology

## 2024-02-20 LAB — HM MAMMOGRAPHY

## 2024-02-23 ENCOUNTER — Encounter: Payer: Self-pay | Admitting: Obstetrics and Gynecology

## 2024-02-23 ENCOUNTER — Ambulatory Visit: Payer: Self-pay | Admitting: Obstetrics and Gynecology
